# Patient Record
Sex: Female | Born: 1990 | Race: Black or African American | Hispanic: No | Marital: Single | Smoking: Never smoker
Health system: Southern US, Community
[De-identification: ages and names within clinical notes are randomized; demographics above are authoritative.]

## PROBLEM LIST (undated history)

## (undated) ENCOUNTER — Inpatient Hospital Stay (HOSPITAL_COMMUNITY): Payer: Self-pay

## (undated) DIAGNOSIS — E0590 Thyrotoxicosis, unspecified without thyrotoxic crisis or storm: Secondary | ICD-10-CM

## (undated) DIAGNOSIS — D649 Anemia, unspecified: Secondary | ICD-10-CM

## (undated) HISTORY — DX: Thyrotoxicosis, unspecified without thyrotoxic crisis or storm: E05.90

## (undated) HISTORY — DX: Anemia, unspecified: D64.9

## (undated) HISTORY — PX: ADENOIDECTOMY: SUR15

## (undated) HISTORY — PX: TYMPANOSTOMY TUBE PLACEMENT: SHX32

---

## 2016-04-14 ENCOUNTER — Encounter: Payer: Self-pay | Admitting: *Deleted

## 2016-04-17 ENCOUNTER — Encounter: Payer: Self-pay | Admitting: Family Medicine

## 2016-04-17 ENCOUNTER — Other Ambulatory Visit (HOSPITAL_COMMUNITY)
Admission: RE | Admit: 2016-04-17 | Discharge: 2016-04-17 | Disposition: A | Discharge status: Home / Self Care | Payer: Medicaid Other | Source: Ambulatory Visit | Attending: Family Medicine | Admitting: Family Medicine

## 2016-04-17 ENCOUNTER — Ambulatory Visit (INDEPENDENT_AMBULATORY_CARE_PROVIDER_SITE_OTHER): Payer: Medicaid Other | Admitting: Family Medicine

## 2016-04-17 VITALS — BP 123/78 | HR 117 | Ht 63.0 in | Wt 176.1 lb

## 2016-04-17 DIAGNOSIS — Z113 Encounter for screening for infections with a predominantly sexual mode of transmission: Secondary | ICD-10-CM | POA: Diagnosis not present

## 2016-04-17 DIAGNOSIS — A6009 Herpesviral infection of other urogenital tract: Secondary | ICD-10-CM

## 2016-04-17 DIAGNOSIS — O98312 Other infections with a predominantly sexual mode of transmission complicating pregnancy, second trimester: Secondary | ICD-10-CM | POA: Diagnosis not present

## 2016-04-17 DIAGNOSIS — Z3491 Encounter for supervision of normal pregnancy, unspecified, first trimester: Secondary | ICD-10-CM

## 2016-04-17 DIAGNOSIS — O34219 Maternal care for unspecified type scar from previous cesarean delivery: Secondary | ICD-10-CM

## 2016-04-17 DIAGNOSIS — O98319 Other infections with a predominantly sexual mode of transmission complicating pregnancy, unspecified trimester: Secondary | ICD-10-CM

## 2016-04-17 DIAGNOSIS — Z3480 Encounter for supervision of other normal pregnancy, unspecified trimester: Secondary | ICD-10-CM | POA: Insufficient documentation

## 2016-04-17 LAB — POCT URINALYSIS DIP (DEVICE)
BILIRUBIN URINE: NEGATIVE
GLUCOSE, UA: 100 mg/dL — AB
Hgb urine dipstick: NEGATIVE
KETONES UR: NEGATIVE mg/dL
LEUKOCYTES UA: NEGATIVE
NITRITE: NEGATIVE
Protein, ur: NEGATIVE mg/dL
Specific Gravity, Urine: 1.025 (ref 1.005–1.030)
Urobilinogen, UA: 1 mg/dL (ref 0.0–1.0)
pH: 6.5 (ref 5.0–8.0)

## 2016-04-17 NOTE — Addendum Note (Signed)
Addended by: Faythe CasaBELLAMY, Ramina Hulet M on: 04/17/2016 03:40 PM   Modules accepted: Orders

## 2016-04-17 NOTE — Progress Notes (Signed)
New OB Note  04/17/2016   [facility]  Chief Complaint: New OB  Transfer of Care Patient: yes  History of Present Illness: [name] is a 25 y.o. G2P0 @ 20 5/7 weeks (EDC 08/30/16, based on LMP c/w 7 wk US. Patient's last menstrual period was 11/24/2015.), with the above CC. Preg complicated by has Normal pregnancy and Previous cesarean delivery affecting pregnancy, antepartum on her problem list. Has questionable hypothyroidism?, per patient only in pregnancy, not currently on medications or previously.  Her periods were: regular periods every 28 days She was using no method when she conceived.  She has Negative signs or symptoms of nausea/vomiting of pregnancy. She has Negative signs or symptoms of miscarriage or preterm labor She identifies Negative Zika risk factors for her and her partner  ROS: A 12-point review of systems was performed and negative, except as stated in the above HPI.  OBGYN History: As per HPI. OB History  Gravida Para Term Preterm AB Living  2         1  SAB TAB Ectopic Multiple Live Births          1    # Outcome Date GA Lbr Len/2nd Weight Sex Delivery Anes PTL Lv  2 Current           1 Gravida 03/14/13   6 lb 12 oz (3.062 kg) F CS-Unspec  N LIV      Any prior children are healthy, doing well, without any problems or issues: yes History of pap smears: Yes. Last pap smear Unsure. Abnormal: no. History of STIs: Yes - Genital HSV   Past Medical History: Past Medical History:  Diagnosis Date  . Anemia   . Hyperthyroidism     Past Surgical History: Past Surgical History:  Procedure Laterality Date  . CESAREAN SECTION      Family History:  Family History  Problem Relation Age of Onset  . Depression Maternal Grandmother    She denies any female cancers, bleeding or blood clotting disorders.  She denies any history of mental retardation, birth defects or genetic disorders in her or the FOB's history  Social History:  Social History   Social  History  . Marital status: Single    Spouse name: N/A  . Number of children: N/A  . Years of education: N/A   Occupational History  . Not on file.   Social History Main Topics  . Smoking status: Never Smoker  . Smokeless tobacco: Never Used  . Alcohol use Not on file  . Drug use: No  . Sexual activity: Yes   Other Topics Concern  . Not on file   Social History Narrative  . No narrative on file   Any pets in the household: yes - CATS    Allergy: Allergies not on file  Health Maintenance:  Mammogram Up to Date: not applicable  Current Outpatient Medications: PNV  Physical Exam:   BP 123/78   Pulse (!) 117   Ht 5\' 3"  (1.6 m)   Wt 176 lb 1.6 oz (79.9 kg)   LMP 11/24/2015   BMI 31.19 kg/m  Body mass index is 31.19 kg/m. Fundal height: 21 cm FHTs: 150  General appearance: Well nourished, well developed female in no acute distress.  Neck:  Supple, normal appearance, and no thyromegaly  Cardiovascular: S1, S2 normal, no murmur, rub or gallop, regular rate and rhythm Respiratory:  Clear to auscultation bilateral. Normal respiratory effort Abdomen: positive bowel sounds and no masses, hernias;  diffusely non tender to palpation, non distended Breasts: breasts appear normal, no suspicious masses, no skin or nipple changes or axillary nodes. Neuro/Psych:  Normal mood and affect.  Skin:  Warm and dry.  Lymphatic:  No inguinal lymphadenopathy.    Laboratory: Please see records from [place] (all WNL)  Imaging:  Please see records for [place] from [place], normal.  Assessment/Plan:  1. Normal pregnancy, first trimester  - Prenatal Profile - Hemoglobinopathy evaluation - Pain Mgmt, Profile 6 Conf w/o mM, U - Glucose Tolerance, 1 HR (50g) - GC/Chlamydia Probe Amp - TSH, free T4 ( per patient, told she has hypothyroidism)  2. Genital herpes affecting pregnancy - Suppressive therapy at 36 weeks  3. Previous cesarean delivery affecting pregnancy,  antepartum - Due to genital herpes outbreak at time of induction   Problem list reviewed and updated.  Follow up in 4 weeks.  >50% of 30 min visit spent on counseling and coordination of care.     Cleda Clarks, DO [facility])

## 2016-04-17 NOTE — Addendum Note (Signed)
Addended by: Sherre LainASH, Madesyn Ast A on: 04/17/2016 02:22 PM   Modules accepted: Orders

## 2016-04-17 NOTE — Patient Instructions (Signed)
Second Trimester of Pregnancy The second trimester is from week 13 through week 28, months 4 through 6. The second trimester is often a time when you feel your best. Your body has also adjusted to being pregnant, and you begin to feel better physically. Usually, morning sickness has lessened or quit completely, you may have more energy, and you may have an increase in appetite. The second trimester is also a time when the fetus is growing rapidly. At the end of the sixth month, the fetus is about 9 inches long and weighs about 1 pounds. You will likely begin to feel the baby move (quickening) between 18 and 20 weeks of the pregnancy. BODY CHANGES Your body goes through many changes during pregnancy. The changes vary from woman to woman.   Your weight will continue to increase. You will notice your lower abdomen bulging out.  You may begin to get stretch marks on your hips, abdomen, and breasts.  You may develop headaches that can be relieved by medicines approved by your health care provider.  You may urinate more often because the fetus is pressing on your bladder.  You may develop or continue to have heartburn as a result of your pregnancy.  You may develop constipation because certain hormones are causing the muscles that push waste through your intestines to slow down.  You may develop hemorrhoids or swollen, bulging veins (varicose veins).  You may have back pain because of the weight gain and pregnancy hormones relaxing your joints between the bones in your pelvis and as a result of a shift in weight and the muscles that support your balance.  Your breasts will continue to grow and be tender.  Your gums may bleed and may be sensitive to brushing and flossing.  Dark spots or blotches (chloasma, mask of pregnancy) may develop on your face. This will likely fade after the baby is born.  A dark line from your belly button to the pubic area (linea nigra) may appear. This will likely fade  after the baby is born.  You may have changes in your hair. These can include thickening of your hair, rapid growth, and changes in texture. Some women also have hair loss during or after pregnancy, or hair that feels dry or thin. Your hair will most likely return to normal after your baby is born. WHAT TO EXPECT AT YOUR PRENATAL VISITS During a routine prenatal visit:  You will be weighed to make sure you and the fetus are growing normally.  Your blood pressure will be taken.  Your abdomen will be measured to track your baby's growth.  The fetal heartbeat will be listened to.  Any test results from the previous visit will be discussed. Your health care provider may ask you:  How you are feeling.  If you are feeling the baby move.  If you have had any abnormal symptoms, such as leaking fluid, bleeding, severe headaches, or abdominal cramping.  If you are using any tobacco products, including cigarettes, chewing tobacco, and electronic cigarettes.  If you have any questions. Other tests that may be performed during your second trimester include:  Blood tests that check for:  Low iron levels (anemia).  Gestational diabetes (between 24 and 28 weeks).  Rh antibodies.  Urine tests to check for infections, diabetes, or protein in the urine.  An ultrasound to confirm the proper growth and development of the baby.  An amniocentesis to check for possible genetic problems.  Fetal screens for spina bifida   and Down syndrome.  HIV (human immunodeficiency virus) testing. Routine prenatal testing includes screening for HIV, unless you choose not to have this test. HOME CARE INSTRUCTIONS   Avoid all smoking, herbs, alcohol, and unprescribed drugs. These chemicals affect the formation and growth of the baby.  Do not use any tobacco products, including cigarettes, chewing tobacco, and electronic cigarettes. If you need help quitting, ask your health care provider. You may receive  counseling support and other resources to help you quit.  Follow your health care provider's instructions regarding medicine use. There are medicines that are either safe or unsafe to take during pregnancy.  Exercise only as directed by your health care provider. Experiencing uterine cramps is a good sign to stop exercising.  Continue to eat regular, healthy meals.  Wear a good support bra for breast tenderness.  Do not use hot tubs, steam rooms, or saunas.  Wear your seat belt at all times when driving.  Avoid raw meat, uncooked cheese, cat litter boxes, and soil used by cats. These carry germs that can cause birth defects in the baby.  Take your prenatal vitamins.  Take 1500-2000 mg of calcium daily starting at the 20th week of pregnancy until you deliver your baby.  Try taking a stool softener (if your health care provider approves) if you develop constipation. Eat more high-fiber foods, such as fresh vegetables or fruit and whole grains. Drink plenty of fluids to keep your urine clear or pale yellow.  Take warm sitz baths to soothe any pain or discomfort caused by hemorrhoids. Use hemorrhoid cream if your health care provider approves.  If you develop varicose veins, wear support hose. Elevate your feet for 15 minutes, 3-4 times a day. Limit salt in your diet.  Avoid heavy lifting, wear low heel shoes, and practice good posture.  Rest with your legs elevated if you have leg cramps or low back pain.  Visit your dentist if you have not gone yet during your pregnancy. Use a soft toothbrush to brush your teeth and be gentle when you floss.  A sexual relationship may be continued unless your health care provider directs you otherwise.  Continue to go to all your prenatal visits as directed by your health care provider. SEEK MEDICAL CARE IF:   You have dizziness.  You have mild pelvic cramps, pelvic pressure, or nagging pain in the abdominal area.  You have persistent nausea,  vomiting, or diarrhea.  You have a bad smelling vaginal discharge.  You have pain with urination. SEEK IMMEDIATE MEDICAL CARE IF:   You have a fever.  You are leaking fluid from your vagina.  You have spotting or bleeding from your vagina.  You have severe abdominal cramping or pain.  You have rapid weight gain or loss.  You have shortness of breath with chest pain.  You notice sudden or extreme swelling of your face, hands, ankles, feet, or legs.  You have not felt your baby move in over an hour.  You have severe headaches that do not go away with medicine.  You have vision changes.   This information is not intended to replace advice given to you by your health care provider. Make sure you discuss any questions you have with your health care provider.   Document Released: 08/01/2001 Document Revised: 08/28/2014 Document Reviewed: 10/08/2012 Elsevier Interactive Patient Education 2016 [employer].   Herpes During and After Pregnancy Genital herpes is a sexually transmitted infection (STI). It is caused by a virus and  can be very serious during pregnancy. The greatest concern is passing the virus to the fetus and newborn. The virus is more likely to be passed to the newborn during delivery if the mother becomes infected for the first time late in her pregnancy (primary infection). It is less common for the virus to pass to the newborn if the mother had herpes before becoming pregnant. This is because antibodies against the virus develop over a period of time. These antibodies help protect the baby. Lastly, the infection can pass to the fetus through the placenta. This can happen if the mother gets herpes for the first time in the first 3 months of her pregnancy (first trimester). This can possibly cause a miscarriage or birth defects in the baby. TREATMENT DURING PREGNANCY Medicines may be prescribed that are safe for the mother and the fetus. The medicine can lessen symptoms or  prevent a recurrence of the infection. If the infection happened before becoming pregnant, medicine may be prescribed in the last 4 weeks of the pregnancy. This can prevent a recurrent infection at the time of delivery.  RECOMMENDED DELIVERY METHOD If an active, recurrent infection is present at the time delivery, the baby should be delivered by cesarean delivery. This is because the virus can pass to the baby through an infected birth canal. This can cause severe problems for the baby. If the infection happens for the first time late in the pregnancy, the caregiver may also recommend a cesarean delivery. With a new infection, the body has not had the time to build up enough antibodies against the virus to protect the baby from getting the infection. Even if the birth canal does not have visible sores (lesions) from the herpes virus, there is still that chance that the virus can spread to the baby. A cesarean delivery should be done if there is any signs or feeling of an infection being present in the genital area. Cesarean delivery is not recommended for women with a history of herpes infection but no evidence of active genital lesions at the time of delivery. Lesions that have crusted fully are considered healed and not active. BREASTFEEDING  Women infected with genital herpes can breastfeed their baby. The virus will not be present in the breast milk. If lesions are present on the breast, the baby should not breastfeed from the affected breast(s). HOW TO PREVENT PASSING HERPES TO YOUR BABY AFTER DELIVERY  Wash your hands with soap and water often and before touching your baby.  If you have an outbreak, keep the area clean and covered.  Try to avoid physical and stressful situations that may bring on an outbreak. SEEK IMMEDIATE MEDICAL CARE IF:   You have an outbreak during pregnancy and cannot urinate.  You have an outbreak anytime during your pregnancy and especially in the last 3 months of the  pregnancy.  You think you are having an allergic reaction or side effects from the medicine you are taking.   This information is not intended to replace advice given to you by your health care provider. Make sure you discuss any questions you have with your health care provider.   Document Released: 11/13/2000 Document Revised: 08/28/2014 Document Reviewed: 07/18/2011 Elsevier Interactive Patient Education [employer]2016 Elsevier Inc.

## 2016-04-18 LAB — GC/CHLAMYDIA PROBE AMP (~~LOC~~) NOT AT ARMC
Chlamydia: NEGATIVE
Neisseria Gonorrhea: NEGATIVE

## 2016-04-18 LAB — TSH: TSH: 1.38 m[IU]/L

## 2016-04-18 LAB — GLUCOSE TOLERANCE, 1 HOUR (50G) W/O FASTING: Glucose, 1 Hr, gestational: 95 mg/dL (ref <140)

## 2016-04-18 LAB — T4, FREE: FREE T4: 0.7 ng/dL — AB (ref 0.8–1.8)

## 2016-04-19 LAB — PRENATAL PROFILE (SOLSTAS)
Antibody Screen: NEGATIVE
BASOS PCT: 0 %
Basophils Absolute: 0 cells/uL (ref 0–200)
EOS ABS: 83 {cells}/uL (ref 15–500)
EOS PCT: 1 %
HEMATOCRIT: 32.5 % — AB (ref 35.0–45.0)
HEMOGLOBIN: 10.9 g/dL — AB (ref 11.7–15.5)
HIV 1&2 Ab, 4th Generation: NONREACTIVE
Hepatitis B Surface Ag: NEGATIVE
LYMPHS PCT: 22 %
Lymphs Abs: 1826 cells/uL (ref 850–3900)
MCH: 27.9 pg (ref 27.0–33.0)
MCHC: 33.5 g/dL (ref 32.0–36.0)
MCV: 83.1 fL (ref 80.0–100.0)
MONOS PCT: 10 %
MPV: 9.8 fL (ref 7.5–12.5)
Monocytes Absolute: 830 cells/uL (ref 200–950)
Neutro Abs: 5561 cells/uL (ref 1500–7800)
Neutrophils Relative %: 67 %
Platelets: 250 10*3/uL (ref 140–400)
RBC: 3.91 MIL/uL (ref 3.80–5.10)
RDW: 14.9 % (ref 11.0–15.0)
RH TYPE: POSITIVE
Rubella: 1.59 Index — ABNORMAL HIGH (ref <0.90)
WBC: 8.3 10*3/uL (ref 3.8–10.8)

## 2016-04-19 LAB — PAIN MGMT, PROFILE 6 CONF W/O MM, U
6 ACETYLMORPHINE: NEGATIVE ng/mL (ref <10)
AMPHETAMINES: NEGATIVE ng/mL (ref <500)
Alcohol Metabolites: NEGATIVE ng/mL (ref <500)
BARBITURATES: NEGATIVE ng/mL (ref <300)
BENZODIAZEPINES: NEGATIVE ng/mL (ref <100)
CREATININE: 159.8 mg/dL (ref >=20.0)
Cocaine Metabolite: NEGATIVE ng/mL (ref <150)
METHADONE METABOLITE: NEGATIVE ng/mL (ref <100)
Marijuana Metabolite: NEGATIVE ng/mL (ref <20)
OXIDANT: NEGATIVE ug/mL (ref <200)
Opiates: NEGATIVE ng/mL (ref <100)
Oxycodone: NEGATIVE ng/mL (ref <100)
PH: 6.31 (ref 4.5–9.0)
PHENCYCLIDINE: NEGATIVE ng/mL (ref <25)
Please note:: 0

## 2016-04-26 ENCOUNTER — Other Ambulatory Visit: Payer: Self-pay | Admitting: Family Medicine

## 2016-04-26 ENCOUNTER — Ambulatory Visit (HOSPITAL_COMMUNITY)
Admission: RE | Admit: 2016-04-26 | Discharge: 2016-04-26 | Disposition: A | Discharge status: Home / Self Care | Payer: Medicaid Other | Source: Ambulatory Visit | Attending: Family Medicine | Admitting: Family Medicine

## 2016-04-26 DIAGNOSIS — O34219 Maternal care for unspecified type scar from previous cesarean delivery: Secondary | ICD-10-CM

## 2016-04-26 DIAGNOSIS — Z3491 Encounter for supervision of normal pregnancy, unspecified, first trimester: Secondary | ICD-10-CM

## 2016-04-26 DIAGNOSIS — Z1389 Encounter for screening for other disorder: Secondary | ICD-10-CM

## 2016-04-26 DIAGNOSIS — A6009 Herpesviral infection of other urogenital tract: Secondary | ICD-10-CM

## 2016-04-26 DIAGNOSIS — Z36 Encounter for antenatal screening of mother: Secondary | ICD-10-CM | POA: Diagnosis not present

## 2016-04-26 DIAGNOSIS — Z3A22 22 weeks gestation of pregnancy: Secondary | ICD-10-CM

## 2016-04-26 DIAGNOSIS — A609 Anogenital herpesviral infection, unspecified: Secondary | ICD-10-CM | POA: Diagnosis not present

## 2016-04-26 DIAGNOSIS — O98512 Other viral diseases complicating pregnancy, second trimester: Secondary | ICD-10-CM | POA: Diagnosis not present

## 2016-04-26 DIAGNOSIS — O98319 Other infections with a predominantly sexual mode of transmission complicating pregnancy, unspecified trimester: Secondary | ICD-10-CM

## 2016-05-17 ENCOUNTER — Ambulatory Visit (INDEPENDENT_AMBULATORY_CARE_PROVIDER_SITE_OTHER): Payer: Medicaid Other | Admitting: Advanced Practice Midwife

## 2016-05-17 ENCOUNTER — Encounter: Payer: Self-pay | Admitting: Advanced Practice Midwife

## 2016-05-17 VITALS — BP 122/67 | HR 97 | Wt 180.6 lb

## 2016-05-17 DIAGNOSIS — Z23 Encounter for immunization: Secondary | ICD-10-CM

## 2016-05-17 DIAGNOSIS — Z3492 Encounter for supervision of normal pregnancy, unspecified, second trimester: Secondary | ICD-10-CM

## 2016-05-17 MED ORDER — PRENATAL VITAMINS 0.8 MG PO TABS: 1.0000 | ORAL_TABLET | Freq: Every day | ORAL | 12 refills | Status: AC

## 2016-05-17 NOTE — Patient Instructions (Signed)
Vaginal Birth After Cesarean Delivery  Vaginal birth after cesarean delivery (VBAC) is giving birth vaginally after previously delivering a baby by a cesarean. In the past, if a woman had a cesarean delivery, all births afterward would be done by cesarean delivery. This is no longer true. It can be safe for the mother to try a vaginal delivery after having a cesarean delivery.   It is important to discuss VBAC with your health care provider early in the pregnancy so you can understand the risks, benefits, and options. It will give you time to decide what is best in your particular case. The final decision about whether to have a VBAC or repeat cesarean delivery should be between you and your health care provider. Any changes in your health or your baby's health during your pregnancy may make it necessary to change your initial decision about VBAC.   WOMEN WHO PLAN TO HAVE A VBAC SHOULD CHECK WITH THEIR HEALTH CARE PROVIDER TO BE SURE THAT:  · The previous cesarean delivery was done with a low transverse uterine cut (incision) (not a vertical classical incision).    · The birth canal is big enough for the baby.    · There were no other operations on the uterus.    · An electronic fetal monitor (EFM) will be on at all times during labor.    · An operating room will be available and ready in case an emergency cesarean delivery is needed.    · A health care provider and surgical nursing staff will be available at all times during labor to be ready to do an emergency delivery cesarean if necessary.    · An anesthesiologist will be present in case an emergency cesarean delivery is needed.    · The nursery is prepared and has adequate personnel and necessary equipment available to care for the baby in case of an emergency cesarean delivery.  BENEFITS OF VBAC  · Shorter stay in the hospital.    · Avoidance of risks associated with cesarean delivery, such as:    Surgical complications, such as opening of the incision or  hernia in the incision.    Injury to other organs.    Fever. This can occur if an infection develops after surgery. It can also occur as a reaction to the medicine given to make you numb during the surgery.  · Less blood loss and need for blood transfusions.  · Lower risk of blood clots and infection.   · Shorter recovery.    · Decreased risk for having to remove the uterus (hysterectomy).    · Decreased risk for the placenta to completely or partially cover the opening of the uterus (placenta previa) with a future pregnancy.    · Decrease risk in future labor and delivery.  RISKS OF A VBAC  · Tearing (rupture) of the uterus. This is occurs in less than 1% of VBACs. The risk of this happening is higher if:    Steps are taken to begin the labor process (induce labor) or stimulate or strengthen contractions (augment labor).      Medicine is used to soften (ripen) the cervix.  · Having to remove the uterus (hysterectomy) if it ruptures.  VBAC SHOULD NOT BE DONE IF:  · The previous cesarean delivery was done with a vertical (classical) or T-shaped incision or you do not know what kind of incision was made.    · You had a ruptured uterus.    · You have had certain types of surgery on your uterus, such as removal of uterine fibroids.   Ask your health care provider about other types of surgeries that prevent you from having a VBAC.  · You have certain medical or childbirth (obstetrical) problems.    · There are problems with the baby.    · You have had two previous cesarean deliveries and no vaginal deliveries.  OTHER FACTS TO KNOW ABOUT VBAC:  · It is safe to have an epidural anesthetic with VBAC.    · It is safe to turn the baby from a breech position (attempt an external cephalic version).    · It is safe to try a VBAC with twins.    · VBAC may not be successful if your baby weights 8.8 lb (4 kg) or more. However, weight predictions are not always accurate and should not be used alone to decide if VBAC is right for  you.  · There is an increased failure rate if the time between the cesarean delivery and VBAC is less than 19 months.    · Your health care provider may advise against a VBAC if you have preeclampsia (high blood pressure, protein in the urine, and swelling of face and extremities).    · VBAC is often successful if you previously gave birth vaginally.    · VBAC is often successful when the labor starts spontaneously before the due date.    · Delivering a baby through a VBAC is similar to having a normal spontaneous vaginal delivery.     This information is not intended to replace advice given to you by your health care provider. Make sure you discuss any questions you have with your health care provider.     Document Released: 01/28/2007 Document Revised: 08/28/2014 Document Reviewed: 03/06/2013  Elsevier Interactive Patient Education ©2016 Elsevier Inc.

## 2016-05-17 NOTE — Progress Notes (Signed)
Flu vaccine today 

## 2016-05-22 NOTE — Progress Notes (Signed)
Addendum:  Medicaid home form completed 05/22/16.

## 2016-05-24 NOTE — Progress Notes (Signed)
   PRENATAL VISIT NOTE  Subjective:  Mary Stanton is a 25 y.o. G3P1001 at 3018w0d being seen today for ongoing prenatal care.  She is currently monitored for the following issues for this low-risk pregnancy and has Normal pregnancy; Previous cesarean delivery affecting pregnancy, antepartum; and Genital herpes affecting pregnancy on her problem list.  Patient reports no complaints.  Contractions: Irritability. Vag. Bleeding: None.  Movement: Present. Denies leaking of fluid.   The following portions of the patient's history were reviewed and updated as appropriate: allergies, current medications, past family history, past medical history, past social history, past surgical history and problem list. Problem list updated.  Objective:   Vitals:   05/17/16 1052  BP: 122/67  Pulse: 97  Weight: 180 lb 9.6 oz (81.9 kg)    Fetal Status: Fetal Heart Rate (bpm): 141 Fundal Height: 26 cm Movement: Present     General:  Alert, oriented and cooperative. Patient is in no acute distress.  Skin: Skin is warm and dry. No rash noted.   Cardiovascular: Normal heart rate noted  Respiratory: Normal respiratory effort, no problems with respiration noted  Abdomen: Soft, gravid, appropriate for gestational age. Pain/Pressure: Absent     Pelvic:  Cervical exam deferred        Extremities: Normal range of motion.  Edema: None  Mental Status: Normal mood and affect. Normal behavior. Normal judgment and thought content.   Urinalysis:      Assessment and Plan:  Pregnancy: G3P1001 at 7380w0d  1. Needs flu shot  - Flu Vaccine QUAD 36+ mos IM (Fluarix & Fluzone Quad PF  2. Supervision of normal pregnancy in second trimester  - Prenatal Multivit-Min-Fe-FA (PRENATAL VITAMINS) 0.8 MG tablet; Take 1 tablet by mouth daily.  Dispense: 30 tablet; Refill: 12  3. Normal pregnancy, second trimester   Preterm labor symptoms and general obstetric precautions including but not limited to vaginal bleeding,  contractions, leaking of fluid and fetal movement were reviewed in detail with the patient. Please refer to After Visit Summary for other counseling recommendations.  Return in about 3 weeks (around 06/07/2016) for ROB/GTT.  Mary Stanton, CNM

## 2016-06-07 ENCOUNTER — Encounter: Payer: Medicaid Other | Admitting: Obstetrics and Gynecology

## 2016-06-09 ENCOUNTER — Encounter (HOSPITAL_COMMUNITY): Payer: Self-pay

## 2016-06-09 ENCOUNTER — Inpatient Hospital Stay (HOSPITAL_COMMUNITY)
Admission: AD | Admit: 2016-06-09 | Discharge: 2016-06-10 | Disposition: A | Discharge status: Home / Self Care | Payer: Medicaid Other | Source: Ambulatory Visit | Attending: Obstetrics and Gynecology | Admitting: Obstetrics and Gynecology

## 2016-06-09 DIAGNOSIS — R0602 Shortness of breath: Secondary | ICD-10-CM | POA: Diagnosis present

## 2016-06-09 DIAGNOSIS — O26893 Other specified pregnancy related conditions, third trimester: Secondary | ICD-10-CM | POA: Diagnosis not present

## 2016-06-09 DIAGNOSIS — Z3A28 28 weeks gestation of pregnancy: Secondary | ICD-10-CM | POA: Diagnosis not present

## 2016-06-09 DIAGNOSIS — O26899 Other specified pregnancy related conditions, unspecified trimester: Secondary | ICD-10-CM

## 2016-06-09 LAB — BASIC METABOLIC PANEL
Anion gap: 7 (ref 5–15)
BUN: 6 mg/dL (ref 6–20)
CALCIUM: 8.9 mg/dL (ref 8.9–10.3)
CO2: 23 mmol/L (ref 22–32)
CREATININE: 0.52 mg/dL (ref 0.44–1.00)
Chloride: 105 mmol/L (ref 101–111)
GFR calc non Af Amer: >60 mL/min (ref >60)
Glucose, Bld: 81 mg/dL (ref 65–99)
Potassium: 3.6 mmol/L (ref 3.5–5.1)
Sodium: 135 mmol/L (ref 135–145)

## 2016-06-09 LAB — CBC
HEMATOCRIT: 30.7 % — AB (ref 36.0–46.0)
Hemoglobin: 10.6 g/dL — ABNORMAL LOW (ref 12.0–15.0)
MCH: 28.3 pg (ref 26.0–34.0)
MCHC: 34.5 g/dL (ref 30.0–36.0)
MCV: 81.9 fL (ref 78.0–100.0)
Platelets: 233 10*3/uL (ref 150–400)
RBC: 3.75 MIL/uL — ABNORMAL LOW (ref 3.87–5.11)
RDW: 14.7 % (ref 11.5–15.5)
WBC: 8.2 10*3/uL (ref 4.0–10.5)

## 2016-06-09 NOTE — MAU Note (Addendum)
Having trouble breathing since 1700. Worse when I lie down. No hx of asthma. I usually work at night and laid down at 1700 to take a nap and that's when noticed was having trouble breathing. One day this wk when I got out of the shower "I saw stars". Denies LOF, bleeding, pain

## 2016-06-09 NOTE — MAU Provider Note (Signed)
Chief Complaint:  SOB   HPI: Mary Stanton is a 25 y.o. G2P0001 at 3536w2d who presents to maternity admissions reporting SOB.  Around 5 PM today, she noticed sudden onset of SOB, while she was laying down. Minimal improvement with sitting up or standing up. Worse with laying down, completely flat. Does not get worse with walking. Denies CP, F/C. No other symptoms such as dizziness, LOC, palpitations, denies calf tenderness/leg pain or LE edema. Had similar symptoms with previous pregnancy, but did not last as long as this time so never sought help.   Denies family history of blood clots. No self history reported of blood clots or asthma.   Denies contractions, leakage of fluid or vaginal bleeding. Good fetal movement.   Patient states currently in the reclined position, she feels a little SOB still however.   Past Medical History: Past Medical History:  Diagnosis Date  . Anemia   . Hyperthyroidism     Past obstetric history: OB History  Gravida Para Term Preterm AB Living  2 0 0     1  SAB TAB Ectopic Multiple Live Births          1    # Outcome Date GA Lbr Len/2nd Weight Sex Delivery Anes PTL Lv  2 Current           1 Gravida 03/14/13   6 lb 12 oz (3.062 kg) F CS-Unspec  N LIV      Past Surgical History: Past Surgical History:  Procedure Laterality Date  . ADENOIDECTOMY    . CESAREAN SECTION    . TYMPANOSTOMY TUBE PLACEMENT       Family History: Family History  Problem Relation Age of Onset  . Depression Maternal Grandmother     Social History: Social History  Substance Use Topics  . Smoking status: Never Smoker  . Smokeless tobacco: Never Used  . Alcohol use Not on file    Allergies: Not on File  Meds:  Prescriptions Prior to Admission  Medication Sig Dispense Refill Last Dose  . Prenatal Multivit-Min-Fe-FA (PRENATAL VITAMINS) 0.8 MG tablet Take 1 tablet by mouth daily. 30 tablet 12   . Prenatal Vit-Fe Fumarate-FA (PRENATAL MULTIVITAMIN) TABS tablet  Take 1 tablet by mouth daily at 12 noon.       I have reviewed patient's Past Medical Hx, Surgical Hx, Family Hx, Social Hx, medications and allergies.   ROS:  A comprehensive ROS was negative except per HPI.    Physical Exam  Patient Vitals for the past 24 hrs:  BP Temp Pulse Resp SpO2 Height Weight  06/09/16 2245 - - 108 - 99 % - -  06/09/16 2240 - - 97 - 98 % - -  06/09/16 2239 - - 97 - 100 % - -  06/09/16 2223 120/70 97.9 F (36.6 C) 98 18 99 % 5\' 3"  (1.6 m) 186 lb 9.6 oz (84.6 kg)   Constitutional: Well-developed, well-nourished female in no acute distress.  Cardiovascular: normal rate Respiratory: normal effort GI: Abd soft, non-tender, gravid appropriate for gestational age. Pos BS x 4 MS: Extremities nontender, no edema, normal ROM Neurologic: Alert and oriented x 4.  GU: Neg CVAT.   FHT:  Baseline 135, moderate variability, accelerations present, no decelerations Contractions: None   Labs: Results for orders placed or performed during the hospital encounter of 06/09/16 (from the past 24 hour(s))  CBC     Status: Abnormal   Collection Time: 06/09/16 11:16 PM  Result Value Ref Range  WBC 8.2 4.0 - 10.5 K/uL   RBC 3.75 (L) 3.87 - 5.11 MIL/uL   Hemoglobin 10.6 (L) 12.0 - 15.0 g/dL   HCT 40.9 (L) 81.1 - 91.4 %   MCV 81.9 78.0 - 100.0 fL   MCH 28.3 26.0 - 34.0 pg   MCHC 34.5 30.0 - 36.0 g/dL   RDW 78.2 95.6 - 21.3 %   Platelets 233 150 - 400 K/uL  Basic metabolic panel     Status: None   Collection Time: 06/09/16 11:16 PM  Result Value Ref Range   Sodium 135 135 - 145 mmol/L   Potassium 3.6 3.5 - 5.1 mmol/L   Chloride 105 101 - 111 mmol/L   CO2 23 22 - 32 mmol/L   Glucose, Bld 81 65 - 99 mg/dL   BUN 6 6 - 20 mg/dL   Creatinine, Ser 0.86 0.44 - 1.00 mg/dL   Calcium 8.9 8.9 - 57.8 mg/dL   GFR calc non Af Amer >60 >60 mL/min   GFR calc Af Amer >60 >60 mL/min   Anion gap 7 5 - 15    Imaging:  No results found.  MAU Course: Ambulating pulse ox 98-100% on  room air EKG - NSR, no ST/T wave changes; CV exam- no pitting edema, no murmurs, RRR CBC - minimally anemic, no sign of infection BMP - no electrolyte abnormalities, good kidney function Negative Homan's sign Lungs CTAB  I personally reviewed the patient's NST today, found to be REACTIVE. 135 bpm, mod var, +accels, no decels. CTX: None.   MDM: Plan of care reviewed with patient, including labs and tests ordered and medical treatment. Very unlikely pulmonary embolism, arrhythmia, HF, infectious, anemia, preE. Most likely shortness of breath due to pregnancy and positions. Demonstrated appropriate way to lay down/sleep and adjustments. Discussed signs/symptoms to return to MAU/ED.    Assessment: 1. Shortness of breath due to pregnancy     Plan: Discharge home in stable condition.  Preterm Labor precautions and fetal kick counts      Medication List    TAKE these medications   prenatal multivitamin Tabs tablet Take 1 tablet by mouth daily at 12 noon.   Prenatal Vitamins 0.8 MG tablet Take 1 tablet by mouth daily.       [address] [place], [place] 06/09/2016 11:16 PM

## 2016-06-10 DIAGNOSIS — R0602 Shortness of breath: Secondary | ICD-10-CM

## 2016-06-10 DIAGNOSIS — O26893 Other specified pregnancy related conditions, third trimester: Secondary | ICD-10-CM | POA: Diagnosis not present

## 2016-06-10 NOTE — Discharge Instructions (Signed)

## 2016-06-19 ENCOUNTER — Encounter: Payer: Self-pay | Admitting: Student

## 2016-06-19 ENCOUNTER — Ambulatory Visit (INDEPENDENT_AMBULATORY_CARE_PROVIDER_SITE_OTHER): Payer: Medicaid Other | Admitting: Student

## 2016-06-19 VITALS — BP 123/72 | HR 102 | Wt 188.0 lb

## 2016-06-19 DIAGNOSIS — O34219 Maternal care for unspecified type scar from previous cesarean delivery: Secondary | ICD-10-CM

## 2016-06-19 DIAGNOSIS — Z23 Encounter for immunization: Secondary | ICD-10-CM

## 2016-06-19 DIAGNOSIS — A609 Anogenital herpesviral infection, unspecified: Secondary | ICD-10-CM | POA: Diagnosis not present

## 2016-06-19 DIAGNOSIS — Z3493 Encounter for supervision of normal pregnancy, unspecified, third trimester: Secondary | ICD-10-CM

## 2016-06-19 DIAGNOSIS — O98313 Other infections with a predominantly sexual mode of transmission complicating pregnancy, third trimester: Secondary | ICD-10-CM

## 2016-06-19 DIAGNOSIS — A6009 Herpesviral infection of other urogenital tract: Secondary | ICD-10-CM

## 2016-06-19 LAB — CBC
HCT: 33.5 % — ABNORMAL LOW (ref 35.0–45.0)
HEMOGLOBIN: 10.8 g/dL — AB (ref 11.7–15.5)
MCH: 27.3 pg (ref 27.0–33.0)
MCHC: 32.2 g/dL (ref 32.0–36.0)
MCV: 84.6 fL (ref 80.0–100.0)
MPV: 9.7 fL (ref 7.5–12.5)
PLATELETS: 251 10*3/uL (ref 140–400)
RBC: 3.96 MIL/uL (ref 3.80–5.10)
RDW: 14.6 % (ref 11.0–15.0)
WBC: 8.1 10*3/uL (ref 3.8–10.8)

## 2016-06-19 MED ORDER — TETANUS-DIPHTH-ACELL PERTUSSIS 5-2.5-18.5 LF-MCG/0.5 IM SUSP
0.5000 mL | Freq: Once | INTRAMUSCULAR | Status: AC
  Administered 2016-06-19: 0.5 mL via INTRAMUSCULAR

## 2016-06-19 NOTE — Patient Instructions (Signed)

## 2016-06-19 NOTE — Progress Notes (Signed)
1 hr @ 356

## 2016-06-19 NOTE — Progress Notes (Signed)
   PRENATAL VISIT NOTE  Subjective:  Mary MoccasinCourtney Barris is a 25 y.o. G2P1001 at 714w5d being seen today for ongoing prenatal care.  She is currently monitored for the following issues for this low-risk pregnancy and has Normal pregnancy; Previous cesarean delivery affecting pregnancy, antepartum; and Genital herpes affecting pregnancy on her problem list.  Patient reports no complaints.  Contractions: Not present. Vag. Bleeding: None.  Movement: Present. Denies leaking of fluid.   The following portions of the patient's history were reviewed and updated as appropriate: allergies, current medications, past family history, past medical history, past social history, past surgical history and problem list. Problem list updated.  Objective:   Vitals:   06/19/16 1449  BP: 123/72  Pulse: (!) 102  Weight: 188 lb (85.3 kg)    Fetal Status: Fetal Heart Rate (bpm): 146   Movement: Present   Fundal height 29 cm  General:  Alert, oriented and cooperative. Patient is in no acute distress.  Skin: Skin is warm and dry. No rash noted.   Cardiovascular: Normal heart rate noted  Respiratory: Normal respiratory effort, no problems with respiration noted  Abdomen: Soft, gravid, appropriate for gestational age. Pain/Pressure: Absent     Pelvic:  Cervical exam deferred        Extremities: Normal range of motion.  Edema: None  Mental Status: Normal mood and affect. Normal behavior. Normal judgment and thought content.   Assessment and Plan:  Pregnancy: G2P1001 at 484w5d  1. Normal pregnancy in third trimester  - CBC - HIV antibody (with reflex) - RPR - Glucose Tolerance, 1 HR (50g) w/o Fasting  2. Previous cesarean delivery affecting pregnancy, antepartum  -pt considering TOLAC; has information to review  3. Genital herpes affecting pregnancy in third trimester -no recent outbreak or signs of outbreak currently -discussed starting suppressive therapy at 36 wks  Preterm labor symptoms and general  obstetric precautions including but not limited to vaginal bleeding, contractions, leaking of fluid and fetal movement were reviewed in detail with the patient. Please refer to After Visit Summary for other counseling recommendations.  Return in about 2 weeks (around 07/03/2016) for Routine OB.  Judeth HornErin Keith Felten, NP

## 2016-06-20 LAB — RPR

## 2016-06-20 LAB — GLUCOSE TOLERANCE, 1 HOUR (50G) W/O FASTING: GLUCOSE, 1 HR, GESTATIONAL: 85 mg/dL (ref <140)

## 2016-06-20 LAB — HIV ANTIBODY (ROUTINE TESTING W REFLEX): HIV: NONREACTIVE

## 2016-07-04 ENCOUNTER — Ambulatory Visit (INDEPENDENT_AMBULATORY_CARE_PROVIDER_SITE_OTHER): Payer: Medicaid Other | Admitting: Certified Nurse Midwife

## 2016-07-04 VITALS — BP 126/65 | HR 110 | Wt 189.9 lb

## 2016-07-04 DIAGNOSIS — Z3480 Encounter for supervision of other normal pregnancy, unspecified trimester: Secondary | ICD-10-CM

## 2016-07-04 DIAGNOSIS — Z3483 Encounter for supervision of other normal pregnancy, third trimester: Secondary | ICD-10-CM

## 2016-07-04 DIAGNOSIS — O34219 Maternal care for unspecified type scar from previous cesarean delivery: Secondary | ICD-10-CM

## 2016-07-04 NOTE — Progress Notes (Signed)
Subjective:  Mary Stanton is a 25 y.o. G2P1001 at 6262w6d being seen today for ongoing prenatal care.  She is currently monitored for the following issues for this low-risk pregnancy and has Supervision of other normal pregnancy, antepartum; Previous cesarean delivery affecting pregnancy, antepartum; and Genital herpes affecting pregnancy on her problem list.  Patient reports no complaints.  Contractions: Not present. Vag. Bleeding: None.  Movement: Present. Denies leaking of fluid.   The following portions of the patient's history were reviewed and updated as appropriate: allergies, current medications, past family history, past medical history, past social history, past surgical history and problem list. Problem list updated.  Objective:   Vitals:   07/04/16 1536  BP: 126/65  Pulse: (!) 110  Weight: 189 lb 14.4 oz (86.1 kg)    Fetal Status: Fetal Heart Rate (bpm): 142   Movement: Present     General:  Alert, oriented and cooperative. Patient is in no acute distress.  Skin: Skin is warm and dry. No rash noted.   Cardiovascular: Normal heart rate noted  Respiratory: Normal respiratory effort, no problems with respiration noted  Abdomen: Soft, gravid, appropriate for gestational age. Pain/Pressure: Present     Pelvic: Vag. Bleeding: None     Cervical exam deferred        Extremities: Normal range of motion.  Edema: None  Mental Status: Normal mood and affect. Normal behavior. Normal judgment and thought content.   Urinalysis:      Assessment and Plan:  Pregnancy: G2P1001 at 2362w6d  1. Previous cesarean delivery affecting pregnancy, antepartum - VBAC consent signed - need Op note, ROI signed today  2. Supervision of other normal pregnancy, antepartum   Preterm labor symptoms and general obstetric precautions including but not limited to vaginal bleeding, contractions, leaking of fluid and fetal movement were reviewed in detail with the patient. Please refer to After Visit  Summary for other counseling recommendations.  Return in about 2 weeks (around 07/18/2016).   Donette LarryMelanie Zackory Pudlo, CNM

## 2016-07-20 ENCOUNTER — Ambulatory Visit (INDEPENDENT_AMBULATORY_CARE_PROVIDER_SITE_OTHER): Payer: Medicaid Other | Admitting: Advanced Practice Midwife

## 2016-07-20 VITALS — BP 125/66 | HR 112 | Wt 190.0 lb

## 2016-07-20 DIAGNOSIS — Z3493 Encounter for supervision of normal pregnancy, unspecified, third trimester: Secondary | ICD-10-CM | POA: Diagnosis not present

## 2016-07-20 DIAGNOSIS — O34219 Maternal care for unspecified type scar from previous cesarean delivery: Secondary | ICD-10-CM | POA: Diagnosis not present

## 2016-07-20 NOTE — Progress Notes (Signed)
   PRENATAL VISIT NOTE  Subjective:  Mary Stanton is a 25 y.o. G2P1001 at 1113w1d being seen today for ongoing prenatal care.  She is currently monitored for the following issues for this low-risk pregnancy and has Supervision of other normal pregnancy, antepartum; Previous cesarean delivery affecting pregnancy, antepartum; and Genital herpes affecting pregnancy on her problem list.  Patient reports no complaints.  Contractions: Not present. Vag. Bleeding: None.  Movement: Present. Denies leaking of fluid.   The following portions of the patient's history were reviewed and updated as appropriate: allergies, current medications, past family history, past medical history, past social history, past surgical history and problem list. Problem list updated.  Objective:   Vitals:   07/20/16 1526  BP: 125/66  Pulse: (!) 112  Weight: 190 lb (86.2 kg)    Fetal Status: Fetal Heart Rate (bpm): 150   Movement: Present     General:  Alert, oriented and cooperative. Patient is in no acute distress.  Skin: Skin is warm and dry. No rash noted.   Cardiovascular: Normal heart rate noted  Respiratory: Normal respiratory effort, no problems with respiration noted  Abdomen: Soft, gravid, appropriate for gestational age. Pain/Pressure: Present     Pelvic:  Cervical exam deferred        Extremities: Normal range of motion.  Edema: None  Mental Status: Normal mood and affect. Normal behavior. Normal judgment and thought content.   Assessment and Plan:  Pregnancy: G2P1001 at 7113w1d  1. Previous cesarean delivery affecting pregnancy, antepartum --VBAC consent in chart  2. Normal pregnancy in third trimester --Anticipatory guidance about next weeks of pregnancy, next prenatal visits given.  Preterm labor symptoms and general obstetric precautions including but not limited to vaginal bleeding, contractions, leaking of fluid and fetal movement were reviewed in detail with the patient. Please refer to After  Visit Summary for other counseling recommendations.  Return in about 2 weeks (around 08/03/2016).   Hurshel PartyLisa A Leftwich-Kirby, CNM

## 2016-07-20 NOTE — Patient Instructions (Signed)

## 2016-08-03 ENCOUNTER — Encounter: Payer: Medicaid Other | Admitting: Student

## 2016-08-10 ENCOUNTER — Encounter: Payer: Medicaid Other | Admitting: Medical

## 2017-02-20 ENCOUNTER — Encounter (HOSPITAL_COMMUNITY): Payer: Self-pay

## 2017-12-13 ENCOUNTER — Encounter: Payer: Self-pay | Admitting: *Deleted

## 2018-07-03 IMAGING — US US MFM OB COMP +14 WKS
1 series · 14 of 28 positions shown · non-contrast
Comparison: none

[Series 1: us mfm ob comp +14 wks · 85 acquisitions, 14 frames shown]
[im 4/85]
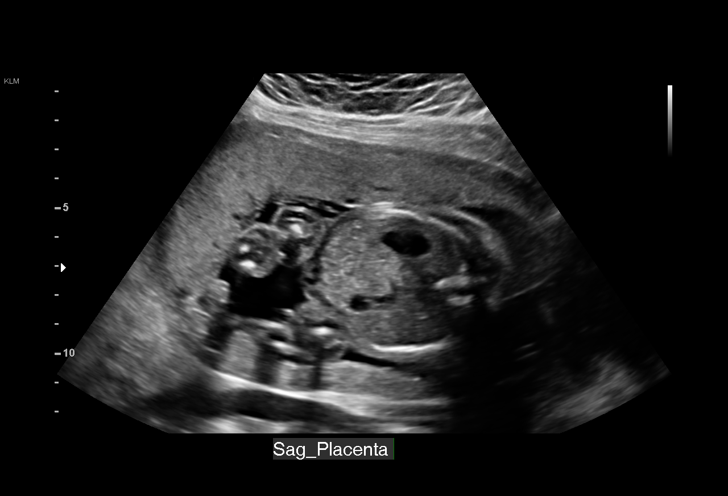
[im 10/85]
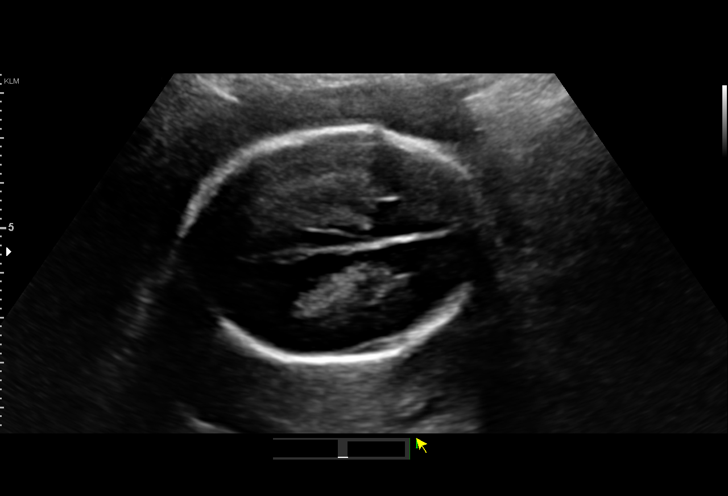
[im 16/85]
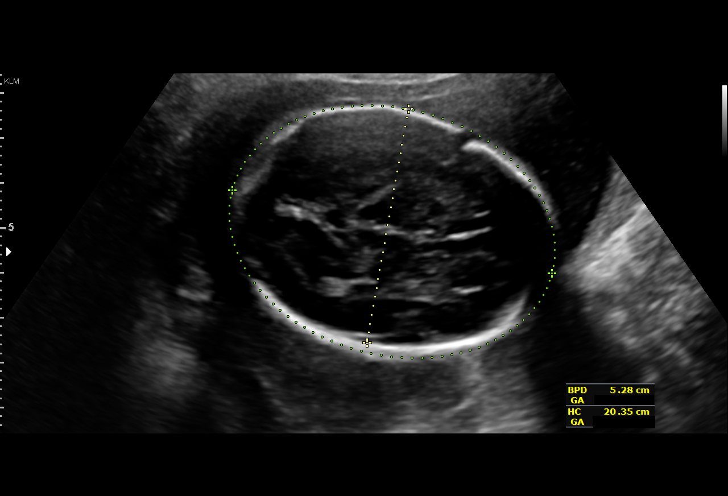
[im 22/85]
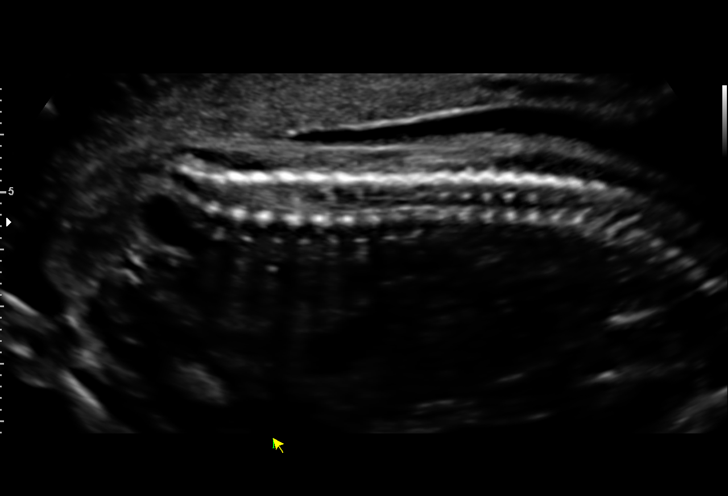
[im 29/85]
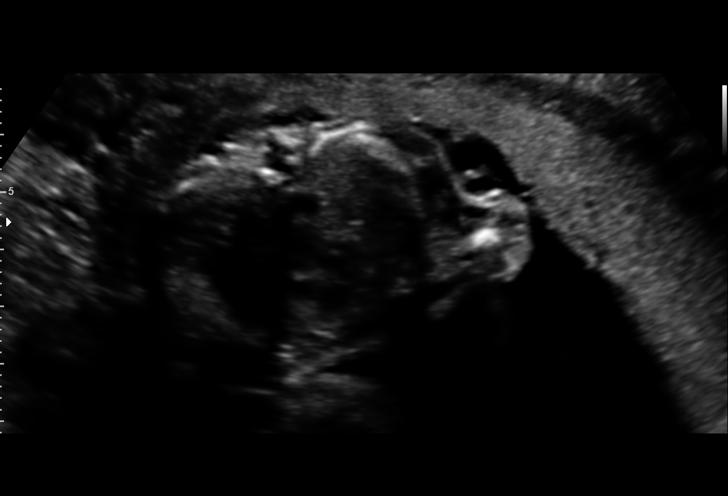
[im 35/85]
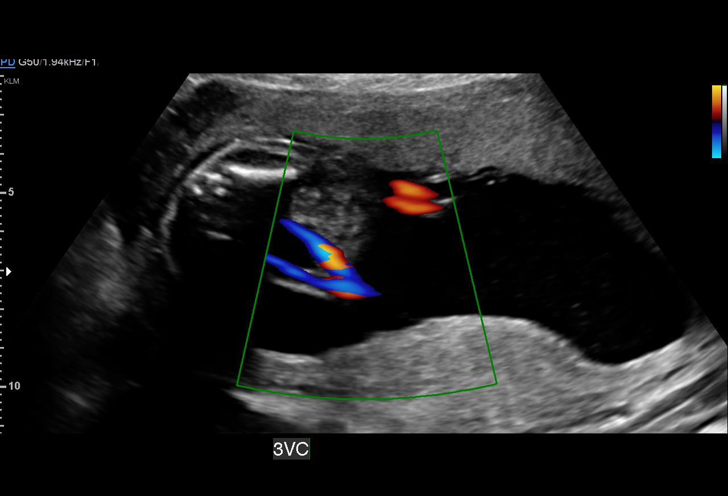
[im 41/85]
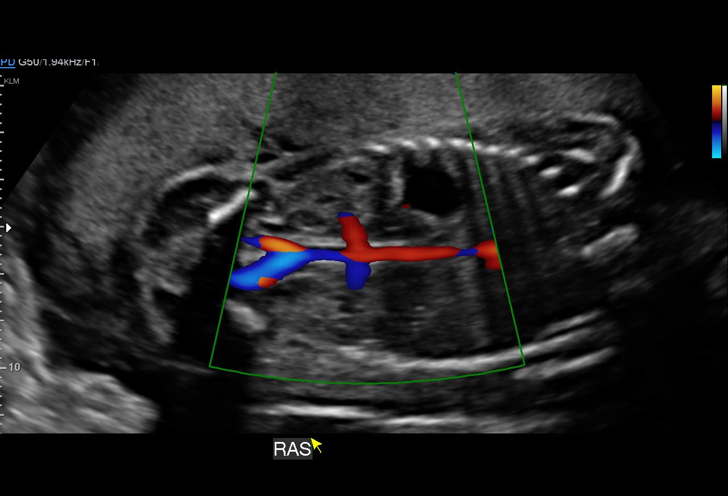
[im 47/85]
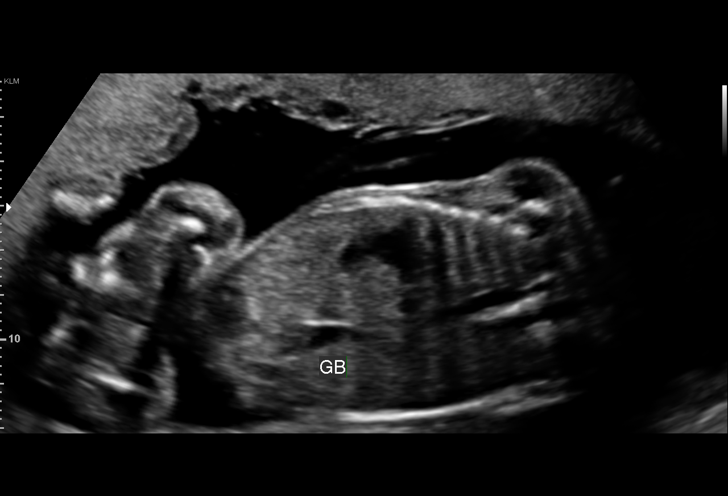
[im 53/85]
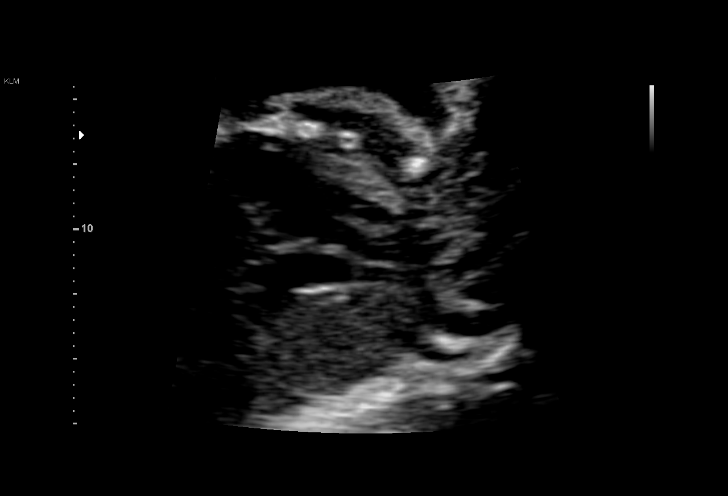
[im 60/85]
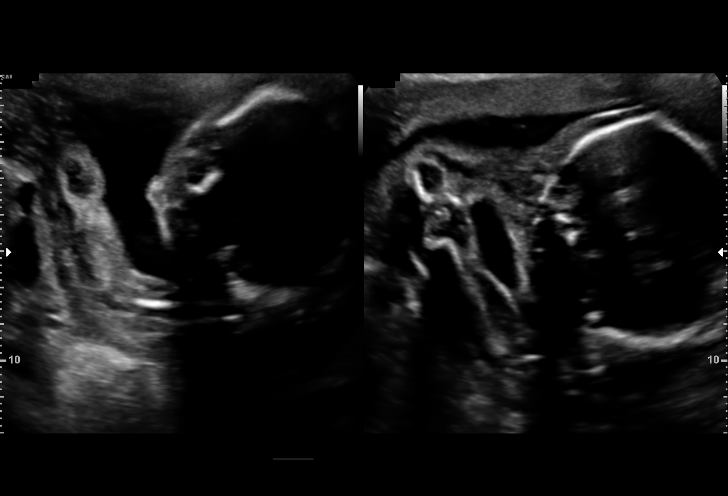
[im 66/85]
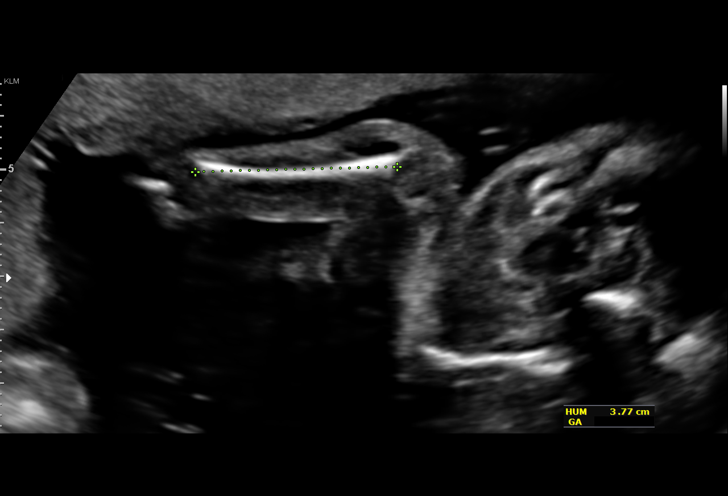
[im 72/85]
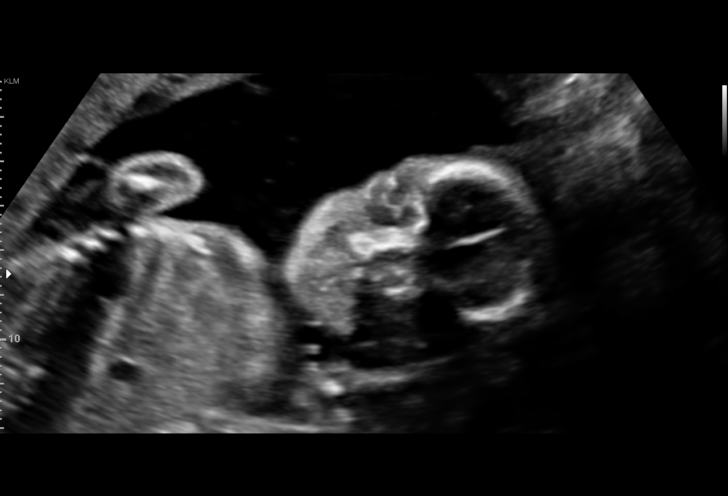
[im 78/85]
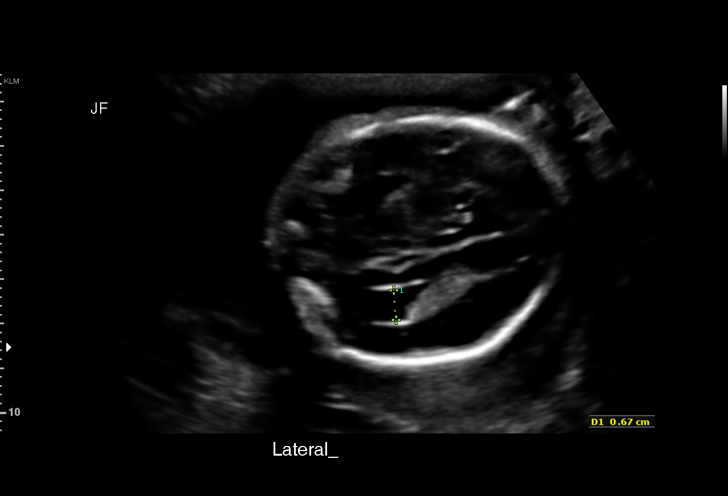
[im 85/85]
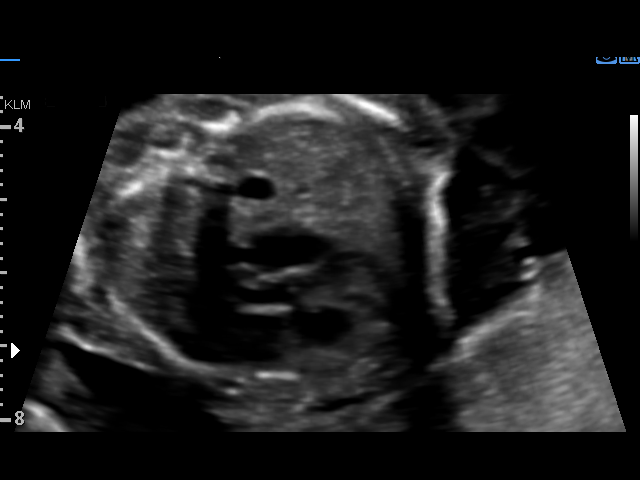

[14 of 28 positions shown; findings below may reference images not displayed]

[place]ulf DO

1  PAALI DOLPHIN          [id number]      [id number]     [id number]
Indications

22 weeks gestation of pregnancy
Basic anatomic survey                          Z36
History of cesarean delivery, currently
pregnant
Herpes simplex virus ([place])
OB History

Gravidity:    2         Term:   1        Prem:   0        SAB:   0
TOP:          0       Ectopic:  0        Living: 1
Fetal Evaluation

Num Of Fetuses:     1
Fetal Heart         153
Rate(bpm):
Cardiac Activity:   Observed
Presentation:       Cephalic
Placenta:           Anterior, above cervical os
P. Cord Insertion:  Visualized

Amniotic Fluid
AFI FV:      Subjectively within normal limits

Largest Pocket(cm)
5.4
Biometry

BPD:      52.9  mm     G. Age:  22w 0d         50  %    CI:        67.88   %    70 - 86
FL/HC:      19.6   %    18.4 -
HC:      205.4  mm     G. Age:  22w 4d         65  %    HC/AC:      1.17        1.06 -
AC:      175.5  mm     G. Age:  22w 3d         57  %    FL/BPD:     76.2   %    71 - 87
FL:       40.3  mm     G. Age:  23w 0d         74  %    FL/AC:      23.0   %    20 - 24

Est. FW:     525  gm      1 lb 3 oz     58  %
Gestational Age

LMP:           22w 0d        Date:  11/24/15                 EDD:   08/30/16
U/S Today:     22w 4d                                        EDD:   08/26/16
Best:          22w 0d     Det. By:  LMP  (11/24/15)          EDD:   08/30/16
Anatomy

Cranium:               Appears normal         Aortic Arch:            Appears normal
Cavum:                 Appears normal         Ductal Arch:            Appears normal
Ventricles:            Appears normal         Diaphragm:              Appears normal
Choroid Plexus:        Appears normal         Stomach:                Appears normal, left
sided
Cerebellum:            Appears normal         Abdomen:                Appears normal
Posterior Fossa:       Appears normal         Abdominal Wall:         Appears nml (cord
insert, abd wall)
Nuchal Fold:           Appears normal         Cord Vessels:           Appears normal (3
vessel cord)
Face:                  Profile nl; orbits not Kidneys:                Appear normal
well visualized
Lips:                  Appears normal         Bladder:                Appears normal
Thoracic:              Appears normal         Spine:                  Appears normal
Heart:                 Appears normal         Upper Extremities:      Appears normal
(4CH, axis, and situs
RVOT:                  Not well visualized    Lower Extremities:      Appears normal
LVOT:                  Appears normal

Other:  Parents do not wish to know sex of fetus. Fetus appears to be a male.
Heels and Left 5th digit visualized. Technically difficult due to fetal
position.
Cervix Uterus Adnexa

Cervix
Normal appearance by transabdominal scan.
Impression

SIUP at 66w1d
active singleton fetus
EFW 58th%'le
no structural defects seen, limited view of RVOT, orbits
no previa
Recommendations

Interval growth and completion of anatomy in 6 weeks.
